# Patient Record
Sex: Female | Born: 1965 | Race: Black or African American | Hispanic: No | Marital: Single | Smoking: Current every day smoker
Health system: Southern US, Community
[De-identification: ages and names within clinical notes are randomized; demographics above are authoritative.]

---

## 2009-05-07 ENCOUNTER — Emergency Department (HOSPITAL_COMMUNITY): Admission: EM | Admit: 2009-05-07 | Discharge: 2009-05-08 | Payer: Self-pay | Admitting: Emergency Medicine

## 2009-12-03 IMAGING — CR DG CERVICAL SPINE COMPLETE 4+V
5 series · 5 of 5 positions shown · non-contrast
Comparison: None

CLINICAL DATA: Neck pain.

CERVICAL SPINE - COMPLETE 4+ VIEW

[w c-spine lat *]
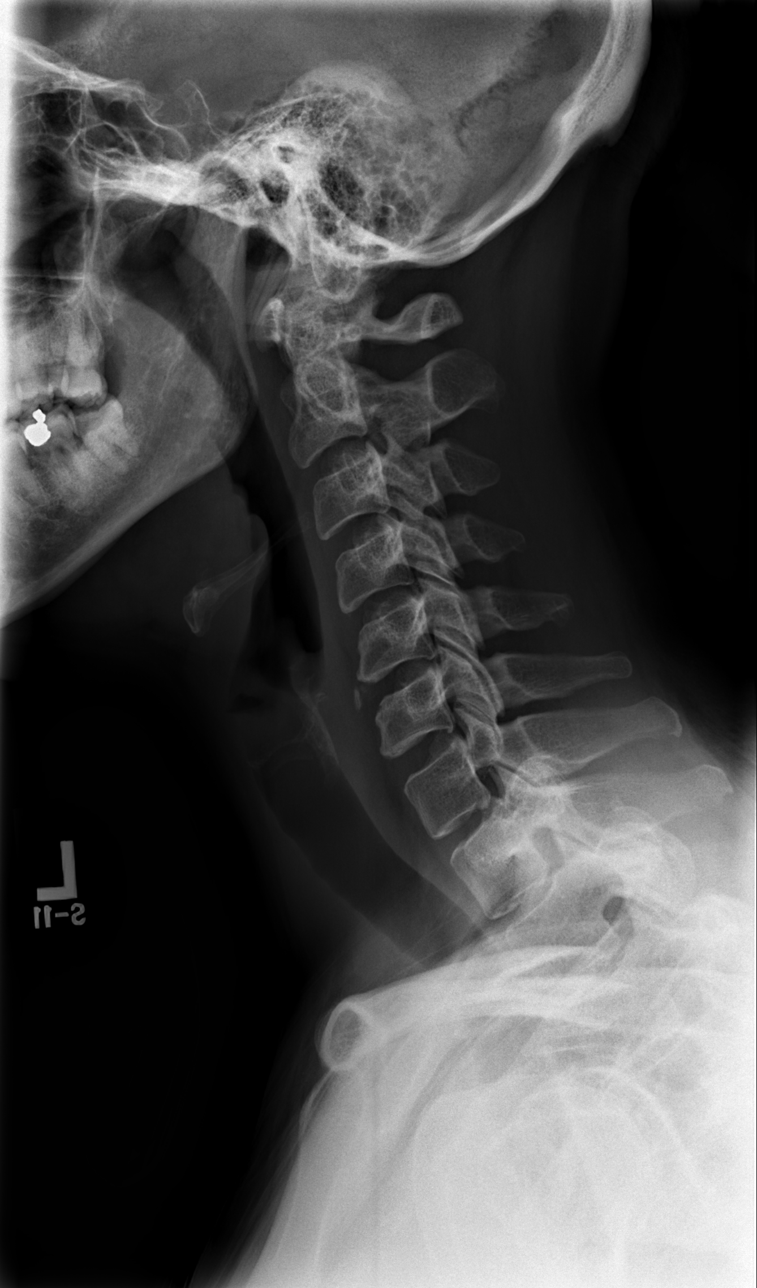

[w c-spine oblique * (1 of 2)]
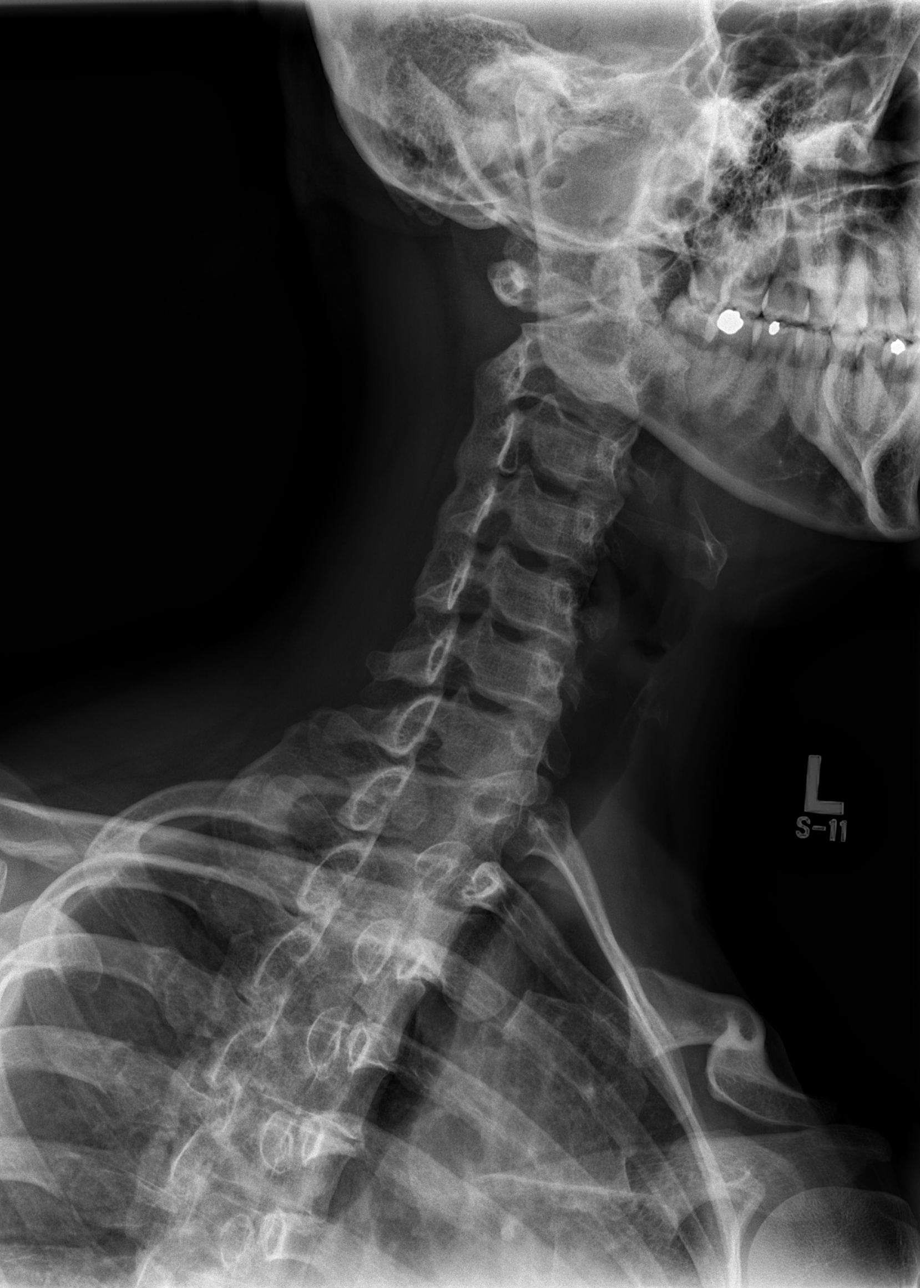

[w c-spine oblique * (2 of 2)]
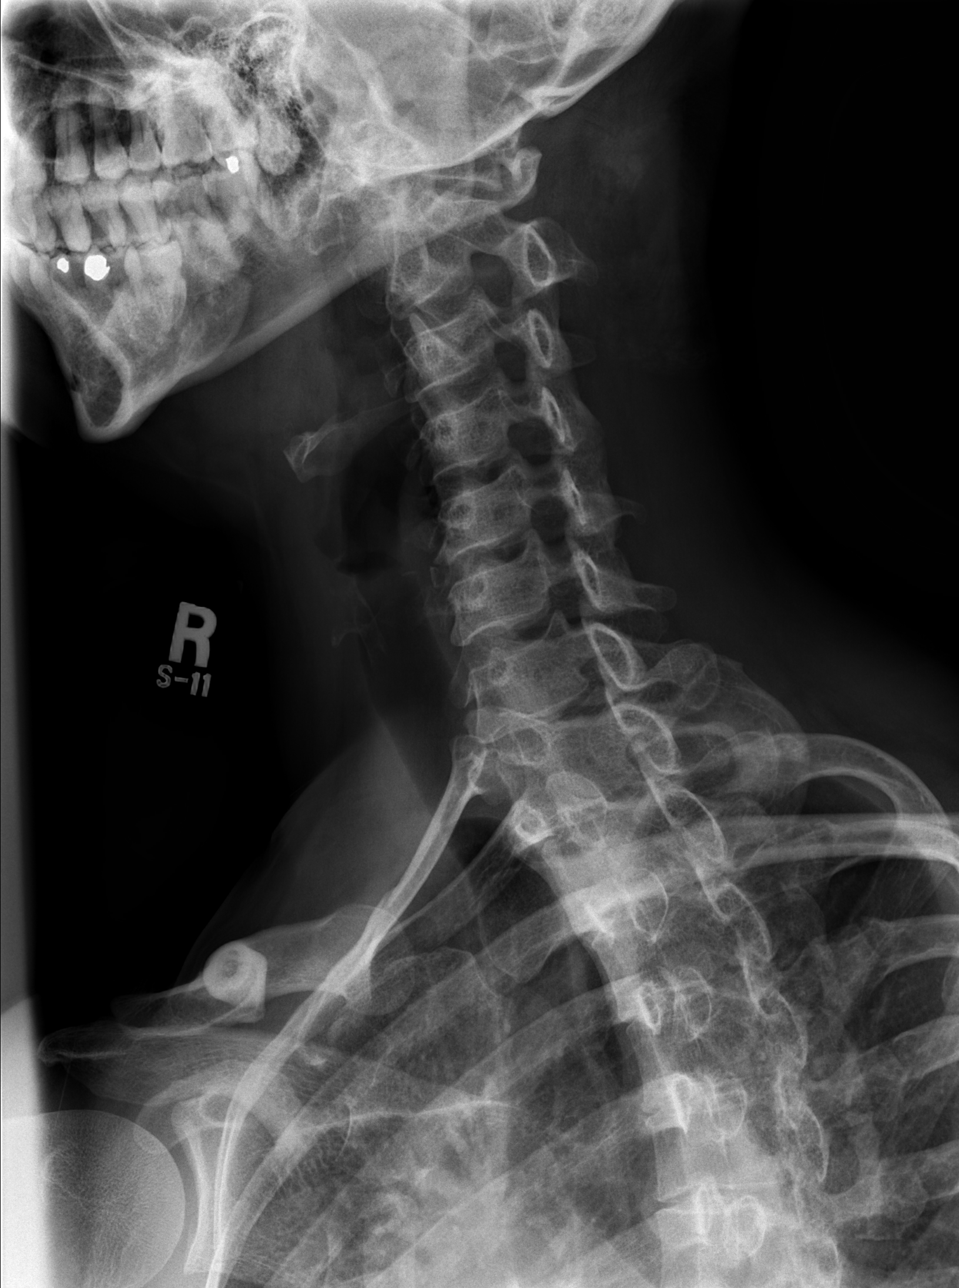

[w c-spine a.p.]
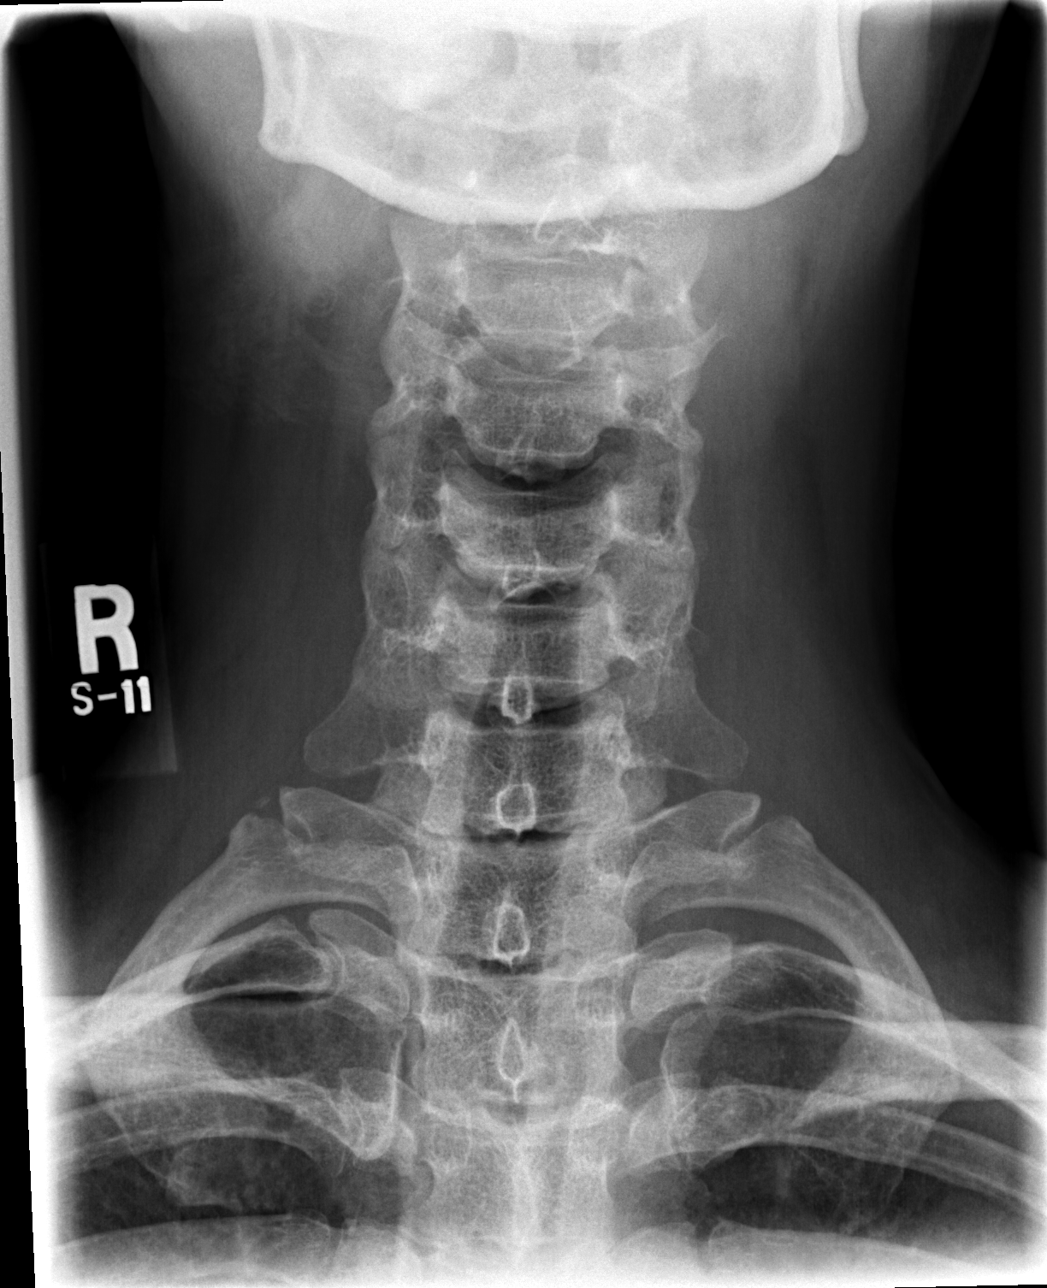

[w c-spine odontoid *]
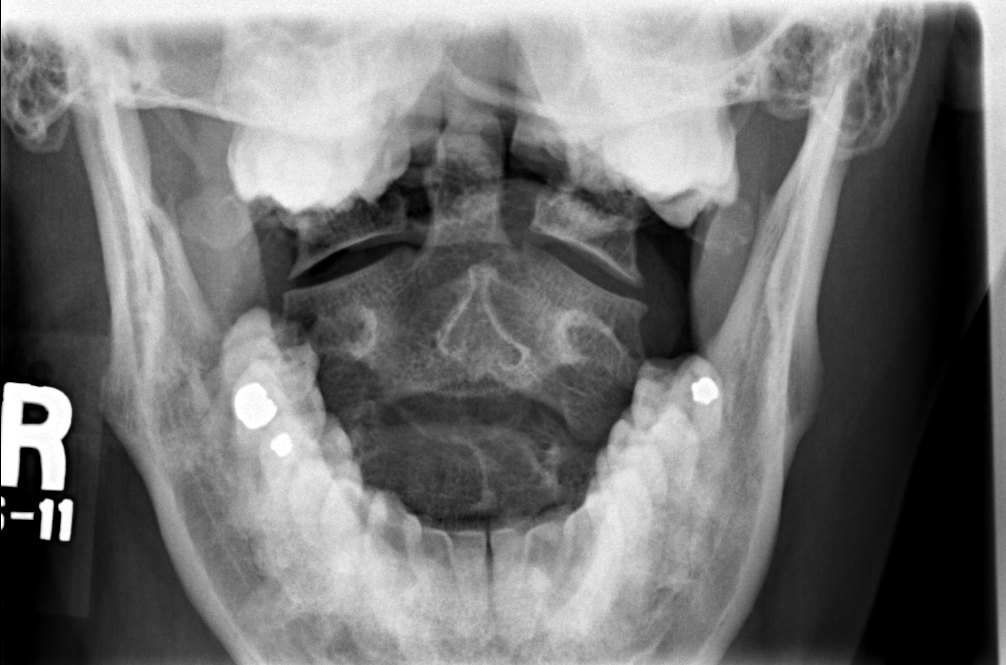

[5 of 5 positions shown; findings below may reference images not displayed]

FINDINGS: AP, lateral, obliques and odontoid view of the cervical
spine were obtained.  Normal alignment of the cervical spine and
cervicothoracic junction.  Normal appearance of the prevertebral
soft tissues.  There is mild narrowing of the right neural foramen
at C3-C4.
IMPRESSION: No acute bone abnormality to the cervical spine.

## 2012-02-01 ENCOUNTER — Emergency Department (HOSPITAL_COMMUNITY)
Admission: EM | Admit: 2012-02-01 | Discharge: 2012-02-01 | Disposition: A | Discharge status: Home / Self Care | Payer: Self-pay | Source: Home / Self Care | Attending: Family Medicine | Admitting: Family Medicine

## 2012-02-01 ENCOUNTER — Encounter (HOSPITAL_COMMUNITY): Payer: Self-pay

## 2012-02-01 DIAGNOSIS — L309 Dermatitis, unspecified: Secondary | ICD-10-CM

## 2012-02-01 DIAGNOSIS — L259 Unspecified contact dermatitis, unspecified cause: Secondary | ICD-10-CM

## 2012-02-01 MED ORDER — NYSTATIN-TRIAMCINOLONE 100000-0.1 UNIT/GM-% EX CREA: TOPICAL_CREAM | CUTANEOUS | Status: AC

## 2012-02-01 MED ORDER — HYDROXYZINE HCL 25 MG PO TABS: 25.0000 mg | ORAL_TABLET | Freq: Three times a day (TID) | ORAL | Status: AC | PRN

## 2012-02-01 NOTE — ED Notes (Signed)
States she had pain in her right arm 1 week ago, and when the pain became focused in her arm pit, she passed gas and felt better; has not had the pain since then; NAD; took no medication for this problem

## 2012-02-01 NOTE — Discharge Instructions (Signed)
Your right arm tender area presents with skin irritation and marks from scratching. Use the prescribed cream as instructed. Avoid scratching as it can cause infection. Also your blood pressure was above normal today 180/90 you need to establish care with a primary care provider and have your blood pressure monitored and rechecked in the next one to 4 weeks.

## 2012-02-04 NOTE — ED Provider Notes (Signed)
History     CSN: [id number]  Arrival date & time 02/01/12  1758   First MD Initiated Contact with Patient 02/01/12 1830      Chief Complaint  Patient presents with  . Arm Pain    (Consider location/radiation/quality/duration/timing/severity/associated sxs/prior treatment) HPI Comments: 46 y/o female. No significant PMH here c/o right arm discomfort. (poor historian). Explains that she had nausea, diarrhea and "indigestion" symptoms last week associated with right arm pain and "after passing gas her right arm pain improved". Denies current abdominal pain. No jaundice. Good appetite. Persistent discomfort in arm pit with itchiness. Denies vesicular rash. Area no longer tender but itchy. Denies current diarrhea, nausea or vomiting.  No prior h/o htn denies headache, blurry vision or chest pain.   History reviewed. No pertinent past medical history.  History reviewed. No pertinent past surgical history.  History reviewed. No pertinent family history.  History  Substance Use Topics  . Smoking status: Not on file  . Smokeless tobacco: Not on file  . Alcohol Use: Not on file    OB History    Grav Para Term Preterm Abortions TAB SAB Ect Mult Living                  Review of Systems  Constitutional: Negative for fever, chills and appetite change.  HENT: Negative for neck pain.   Respiratory: Negative for cough and shortness of breath.   Cardiovascular: Negative for chest pain and palpitations.  Gastrointestinal: Negative for nausea, vomiting, abdominal pain and diarrhea.  Genitourinary: Negative for dysuria.  Skin: Negative for rash.  Neurological: Negative for dizziness, weakness, numbness and headaches.    Allergies  Review of patient's allergies indicates no known allergies.  Home Medications   Current Outpatient Rx  Name Route Sig Dispense Refill  . HYDROXYZINE HCL 25 MG PO TABS Oral Take 1 tablet (25 mg total) by mouth every 8 (eight) hours as needed for itching.  12 tablet 0  . NYSTATIN-TRIAMCINOLONE 100000-0.1 UNIT/GM-% EX CREA  Apply to affected area daily 15 g 0    BP 180/90  Pulse 74  Temp(Src) 98.4 F (36.9 C) (Oral)  Resp 18  SpO2 99%  Physical Exam  Nursing note and vitals reviewed. Constitutional: She is oriented to person, place, and time. She appears well-developed and well-nourished. No distress.  HENT:  Head: Normocephalic and atraumatic.  Mouth/Throat: Oropharynx is clear and moist. No oropharyngeal exudate.  Eyes: Conjunctivae and EOM are normal. Pupils are equal, round, and reactive to light. No scleral icterus.  Neck: Neck supple. No JVD present. No thyromegaly present.  Cardiovascular: Normal rate, regular rhythm and normal heart sounds.   Pulmonary/Chest: Breath sounds normal. She has no wheezes. She has no rales.  Abdominal: Soft. There is no tenderness.  Musculoskeletal:       Right shoulder: FROM. No swelling or deformity. Right arm: normal muscle skeletal exam.  Lymphadenopathy:    She has no cervical adenopathy.  Neurological: She is alert and oriented to person, place, and time.  Skin:       There is a patch of hyperpigmented dry and thick skin in right medial arm proximal to axilla. With marks from scratching. No vesicles. Skin is mildly peeling but no skin brakes or ulcers. No axillary adenopathies. No similar lesions in other body areas.    ED Course  Procedures (including critical care time)  Labs Reviewed - No data to display No results found.   1. Dermatitis  MDM  Non specific pruriginous dermatitis, zoster in the differential but rash at this point is not classic. Prescribed triamcinolone/nystatin, hydroxyzine. Follow up with dermatology as needed. Pt asked to establish care with a PCP ti have BP recked in 1-4 weeks.         Sharin Grave, MD 02/04/12 [phone]

## 2013-12-25 ENCOUNTER — Ambulatory Visit (INDEPENDENT_AMBULATORY_CARE_PROVIDER_SITE_OTHER): Payer: BC Managed Care – PPO | Admitting: Family Medicine

## 2013-12-25 VITALS — BP 152/80 | HR 65 | Temp 98.1°F | Resp 16 | Ht 68.0 in | Wt 175.0 lb

## 2013-12-25 DIAGNOSIS — Z1231 Encounter for screening mammogram for malignant neoplasm of breast: Secondary | ICD-10-CM

## 2013-12-25 DIAGNOSIS — Z111 Encounter for screening for respiratory tuberculosis: Secondary | ICD-10-CM

## 2013-12-25 DIAGNOSIS — Z0000 Encounter for general adult medical examination without abnormal findings: Secondary | ICD-10-CM

## 2013-12-25 LAB — POCT UA - MICROSCOPIC ONLY
Bacteria, U Microscopic: NEGATIVE
Casts, Ur, LPF, POC: NEGATIVE
Crystals, Ur, HPF, POC: NEGATIVE
Mucus, UA: NEGATIVE
RBC, urine, microscopic: NEGATIVE
Yeast, UA: NEGATIVE

## 2013-12-25 LAB — POCT CBC
Granulocyte percent: 50.8 %G (ref 37–80)
HCT, POC: 39.2 % (ref 37.7–47.9)
Hemoglobin: 12.1 g/dL — AB (ref 12.2–16.2)
Lymph, poc: 2.6 (ref 0.6–3.4)
MCH, POC: 28.4 pg (ref 27–31.2)
MCHC: 30.9 g/dL — AB (ref 31.8–35.4)
MCV: 92.1 fL (ref 80–97)
MID (cbc): 0.5 (ref 0–0.9)
MPV: 11.5 fL (ref 0–99.8)
POC Granulocyte: 3.3 (ref 2–6.9)
POC LYMPH PERCENT: 41.4 % (ref 10–50)
POC MID %: 7.8 % (ref 0–12)
Platelet Count, POC: 221 10*3/uL (ref 142–424)
RBC: 4.26 M/uL (ref 4.04–5.48)
RDW, POC: 15.6 %
WBC: 6.4 10*3/uL (ref 4.6–10.2)

## 2013-12-25 LAB — POCT URINALYSIS DIPSTICK
Bilirubin, UA: NEGATIVE
Blood, UA: NEGATIVE
Glucose, UA: NEGATIVE
Leukocytes, UA: NEGATIVE
Nitrite, UA: NEGATIVE
Protein, UA: 30
Spec Grav, UA: 1.02
Urobilinogen, UA: 1
pH, UA: 7

## 2013-12-25 NOTE — Progress Notes (Signed)
Tuberculosis Risk Questionnaire  1. No Were you born outside the [place] in one of the following parts of the world: [place], [place], [place], [place] or [place]?    2. No Have you traveled outside the [place] and lived for more than one month in one of the following parts of the world: [place], [place], [place], [place] or [place]?    3. No Do you have a compromised immune system such as from any of the following conditions:HIV/AIDS, organ or bone marrow transplantation, diabetes, immunosuppressive medicines (e.g. Prednisone, Remicaide), leukemia, lymphoma, cancer of the head or neck, gastrectomy or jejunal bypass, end-stage renal disease (on dialysis), or silicosis?     4. No Have you ever or do you plan on working in: a residential care center, a health care facility, a jail or prison or homeless shelter?    5. No Have you ever: injected illegal drugs, used crack cocaine, lived in a homeless shelter  or been in jail or prison?     6. No Have you ever been exposed to anyone with infectious tuberculosis?    Tuberculosis Symptom Questionnaire  Do you currently have any of the following symptoms?  1. No Unexplained cough lasting more than 3 weeks?   2. No Unexplained fever lasting more than 3 weeks.   3. No Night Sweats (sweating that leaves the bedclothes and sheets wet)     4. No Shortness of Breath   5. No Chest Pain   6. No Unintentional weight loss    7. No Unexplained fatigue (very tired for no reason)        Chief Complaint:  Chief Complaint  Patient presents with  . Annual Exam    For work--needs TB test    HPI: Monica Benitez is a 48 y.o. female who is here for  Annual exam without pap and desires TB test She has not eaten today. She is applying for childcare Work  No medical issues No prior hsitory of TB or sxs Last pap in [place] Dr Abbe [place], in 2014, had abnormal pap , will see him next month to et pap so does not  want anything pelvic related dones.  G13L1 AA female Menarche age 63, still has regular cycle but less flow, q30 days No prior mammogram  History reviewed. No pertinent past medical history. History reviewed. No pertinent past surgical history. History   Social History  . Marital Status: Single    Spouse Name: N/A    Number of Children: N/A  . Years of Education: N/A   Social History Main Topics  . Smoking status: Current Every Day Smoker  . Smokeless tobacco: None  . Alcohol Use: No  . Drug Use: No  . Sexual Activity: None   Other Topics Concern  . None   Social History Narrative  . None   Family History  Problem Relation Age of Onset  . Cancer Father    No Known Allergies Prior to Admission medications   Not on File     ROS: The patient denies fevers, chills, night sweats, unintentional weight loss, chest pain, palpitations, wheezing, dyspnea on exertion, nausea, vomiting, abdominal pain, dysuria, hematuria, melena, numbness, weakness, or tingling.   All other systems have been reviewed and were otherwise negative with the exception of those mentioned in the HPI and as above.    PHYSICAL EXAM: Filed Vitals:   12/25/13 1312  BP: 152/80  Pulse: 65  Temp: 98.1 F (36.7 C)  Resp: 16   Filed Vitals:   12/25/13 1312  Height: 5\' 8"  (1.727 m)  Weight: 175 lb (79.379 kg)   Body mass index is 26.61 kg/(m^2).  General: Alert, no acute distress HEENT:  Normocephalic, atraumatic, oropharynx patent. EOMI, PERRLA, fundo exam nl. Tm nl Cardiovascular:  Regular rate and rhythm, no rubs murmurs or gallops.  No Carotid bruits, radial pulse intact. No pedal edema.  Respiratory: Clear to auscultation bilaterally.  No wheezes, rales, or rhonchi.  No cyanosis, no use of accessory musculature GI: No organomegaly, abdomen is soft and non-tender, positive bowel sounds.  No masses. Skin: No rashes. Neurologic: Facial musculature symmetric. Psychiatric: Patient is appropriate  throughout our interaction. Lymphatic: No cervical lymphadenopathy Musculoskeletal: Gait intact. 5/5 strength, 2/2 DTRs Breast exam normal   LABS: No results found for this or any previous visit.   EKG/XRAY:   Primary read interpreted by [facility].   ASSESSMENT/PLAN: Encounter Diagnoses  Name Primary?  . Routine general medical examination at a health care facility Yes  . Screening-pulmonary TB   . Visit for screening mammogram    401L1 AA female her for annual with PPD testing Has had abnormal pap so will see her ob/gyn Dr Abbe AmsterdamHopkins for pelvic/pap and rectal exam Needs referral for screening mammogram Annual labs pending: CBC, CMP,lipid, UA PPD screening F/u in 48-72 hrs for TB Reading  Gross sideeffects, risk and benefits, and alternatives of medications d/w patient. Patient is aware that all medications have potential sideeffects and we are unable to predict every sideeffect or drug-drug interaction that may occur.  LE, THAO PHUONG, DO 12/25/2013 1:56 PM

## 2013-12-26 LAB — COMPREHENSIVE METABOLIC PANEL WITH GFR
ALT: 12 U/L (ref 0–35)
AST: 13 U/L (ref 0–37)
Albumin: 4.3 g/dL (ref 3.5–5.2)
BUN: 13 mg/dL (ref 6–23)
CO2: 28 meq/L (ref 19–32)
Calcium: 9.5 mg/dL (ref 8.4–10.5)
Chloride: 103 meq/L (ref 96–112)
Creat: 0.82 mg/dL (ref 0.50–1.10)
Potassium: 3.6 meq/L (ref 3.5–5.3)

## 2013-12-26 LAB — LIPID PANEL
Cholesterol: 143 mg/dL (ref 0–200)
HDL: 42 mg/dL (ref >39)
LDL Cholesterol: 89 mg/dL (ref 0–99)
Total CHOL/HDL Ratio: 3.4 Ratio
Triglycerides: 60 mg/dL (ref <150)
VLDL: 12 mg/dL (ref 0–40)

## 2013-12-26 LAB — COMPREHENSIVE METABOLIC PANEL
Alkaline Phosphatase: 49 U/L (ref 39–117)
Glucose, Bld: 73 mg/dL (ref 70–99)
Sodium: 139 mEq/L (ref 135–145)
Total Bilirubin: 0.6 mg/dL (ref 0.2–1.2)
Total Protein: 7 g/dL (ref 6.0–8.3)

## 2013-12-27 ENCOUNTER — Encounter: Payer: Self-pay | Admitting: Family Medicine

## 2013-12-28 ENCOUNTER — Ambulatory Visit (INDEPENDENT_AMBULATORY_CARE_PROVIDER_SITE_OTHER): Payer: BC Managed Care – PPO | Admitting: *Deleted

## 2013-12-28 DIAGNOSIS — Z111 Encounter for screening for respiratory tuberculosis: Secondary | ICD-10-CM

## 2013-12-28 LAB — TB SKIN TEST: TB Skin Test: NEGATIVE

## 2019-08-21 ENCOUNTER — Other Ambulatory Visit: Payer: Self-pay

## 2019-08-21 DIAGNOSIS — Z20822 Contact with and (suspected) exposure to covid-19: Secondary | ICD-10-CM

## 2019-08-23 LAB — NOVEL CORONAVIRUS, NAA: SARS-CoV-2, NAA: DETECTED — AB

## 2019-12-02 ENCOUNTER — Ambulatory Visit: Payer: Self-pay | Attending: Internal Medicine

## 2019-12-02 DIAGNOSIS — Z23 Encounter for immunization: Secondary | ICD-10-CM | POA: Insufficient documentation

## 2019-12-02 NOTE — Progress Notes (Signed)
   Covid-19 Vaccination Clinic  Name:  Monica Benitez    MRN: [id number] DOB: 04/18/1966  12/02/2019  [name] was observed post Covid-19 immunization for 15 minutes without incidence. She was provided with Vaccine Information Sheet and instruction to access the V-Safe system.   [name] was instructed to call 911 with any severe reactions post vaccine: Marland Kitchen Difficulty breathing  . Swelling of your face and throat  . A fast heartbeat  . A bad rash all over your body  . Dizziness and weakness    Immunizations Administered    Name Date Dose VIS Date Route   Pfizer COVID-19 Vaccine 12/02/2019 10:38 AM 0.3 mL 09/15/2019 Intramuscular   Manufacturer: [employer], [employer]   Lot: [id number]   NDC: [address]-6

## 2019-12-27 ENCOUNTER — Ambulatory Visit: Payer: Self-pay | Attending: Internal Medicine

## 2019-12-27 DIAGNOSIS — Z23 Encounter for immunization: Secondary | ICD-10-CM

## 2019-12-27 NOTE — Progress Notes (Signed)
   Covid-19 Vaccination Clinic  Name:  Monica Benitez    MRN: [id number] DOB: 1966/04/23  12/27/2019  [name] was observed post Covid-19 immunization for 15 minutes without incident. She was provided with Vaccine Information Sheet and instruction to access the V-Safe system.   [name] was instructed to call 911 with any severe reactions post vaccine: Marland Kitchen Difficulty breathing  . Swelling of face and throat  . A fast heartbeat  . A bad rash all over body  . Dizziness and weakness   Immunizations Administered    Name Date Dose VIS Date Route   Pfizer COVID-19 Vaccine 12/27/2019  2:01 PM 0.3 mL 09/15/2019 Intramuscular   Manufacturer: [employer], [employer]   Lot: [id number]   NDC: [address]-7

## 2020-10-08 ENCOUNTER — Ambulatory Visit (HOSPITAL_COMMUNITY)
Admission: EM | Admit: 2020-10-08 | Discharge: 2020-10-08 | Disposition: A | Discharge status: Home / Self Care | Payer: Self-pay

## 2023-01-06 ENCOUNTER — Encounter (HOSPITAL_COMMUNITY): Payer: Self-pay

## 2023-01-06 ENCOUNTER — Ambulatory Visit (HOSPITAL_COMMUNITY)
Admission: EM | Admit: 2023-01-06 | Discharge: 2023-01-06 | Disposition: A | Discharge status: Home / Self Care | Payer: BLUE CROSS/BLUE SHIELD | Attending: Emergency Medicine | Admitting: Emergency Medicine

## 2023-01-06 DIAGNOSIS — J029 Acute pharyngitis, unspecified: Secondary | ICD-10-CM

## 2023-01-06 DIAGNOSIS — R030 Elevated blood-pressure reading, without diagnosis of hypertension: Secondary | ICD-10-CM

## 2023-01-06 MED ORDER — DEXAMETHASONE SODIUM PHOSPHATE 10 MG/ML IJ SOLN
10.0000 mg | Freq: Once | INTRAMUSCULAR | Status: AC
  Administered 2023-01-06: 10 mg via INTRAMUSCULAR

## 2023-01-06 MED ORDER — AMOXICILLIN-POT CLAVULANATE 875-125 MG PO TABS: 1.0000 | ORAL_TABLET | Freq: Two times a day (BID) | ORAL | 0 refills | Status: DC

## 2023-01-06 MED ORDER — DEXAMETHASONE SODIUM PHOSPHATE 10 MG/ML IJ SOLN
INTRAMUSCULAR | Status: AC
  Filled 2023-01-06: qty 1

## 2023-01-06 MED ORDER — PROMETHAZINE-DM 6.25-15 MG/5ML PO SYRP: 5.0000 mL | ORAL_SOLUTION | Freq: Three times a day (TID) | ORAL | 0 refills | Status: DC | PRN

## 2023-01-06 NOTE — ED Provider Notes (Signed)
[facility]    CSN: [id number] Arrival date & time: 01/06/23  0810      History   Chief Complaint Chief Complaint  Patient presents with   Otalgia    HPI Monica Benitez is a 57 y.o. female.   Patient presents to clinic for left-sided sore throat, left-sided neck pain and left-sided ear pain with nasal congestion.  Her symptoms have been worsening over the past 3 days, she reports difficulty swallowing and left-sided throat swelling with voice change.  She denies fever, shortness of breath or chest pain.  She has had an intermittent productive cough with phlegm.  She has been taking ibuprofen and medication for sinus congestion/watery eyes over-the-counter.   She does not have a primary care provider and denies any history of hypertension or antihypertensive medications.     The history is provided by the patient and medical records.  Otalgia Associated symptoms: congestion, cough and sore throat   Associated symptoms: no abdominal pain, no ear discharge, no fever, no rash and no vomiting     History reviewed. No pertinent past medical history.  There are no problems to display for this patient.   History reviewed. No pertinent surgical history.  OB History   No obstetric history on file.      Home Medications    Prior to Admission medications   Medication Sig Start Date End Date Taking? Authorizing Provider  amoxicillin-clavulanate (AUGMENTIN) 875-125 MG tablet Take 1 tablet by mouth every 12 (twelve) hours. 01/06/23  Yes Louretta Shorten, [place], FNP  promethazine-dextromethorphan (PROMETHAZINE-DM) 6.25-15 MG/5ML syrup Take 5 mLs by mouth 3 (three) times daily as needed for cough. 01/06/23  Yes Shakeeta Godette, [place], FNP    Family History Family History  Problem Relation Age of Onset   Cancer Father     Social History Social History   Tobacco Use   Smoking status: Every Day  Substance Use Topics   Alcohol use: No   Drug use: No     Allergies    Patient has no known allergies.   Review of Systems Review of Systems  Constitutional:  Negative for chills and fever.  HENT:  Positive for congestion, ear pain, postnasal drip, sore throat, trouble swallowing and voice change. Negative for drooling, ear discharge, nosebleeds, sinus pressure and sinus pain.   Eyes:  Negative for pain, discharge and visual disturbance.  Respiratory:  Positive for cough. Negative for shortness of breath.   Cardiovascular:  Negative for chest pain and palpitations.  Gastrointestinal:  Negative for abdominal pain and vomiting.  Genitourinary:  Negative for dysuria and hematuria.  Musculoskeletal:  Negative for arthralgias and back pain.  Skin:  Negative for color change and rash.  Neurological:  Negative for seizures and syncope.  All other systems reviewed and are negative.    Physical Exam Triage Vital Signs ED Triage Vitals [01/06/23 0823]  Enc Vitals Group     BP (!) 183/119     Pulse Rate 82     Resp 16     Temp 98.2 F (36.8 C)     Temp src      SpO2 99 %     Weight      Height      Head Circumference      Peak Flow      Pain Score 7     Pain Loc      Pain Edu?      Excl. in [place]?    No data  found.  Updated Vital Signs BP (!) 183/119 (BP Location: Left Arm)   Pulse 82   Temp 98.2 F (36.8 C)   Resp 16   SpO2 99%   Visual Acuity Right Eye Distance:   Left Eye Distance:   Bilateral Distance:    Right Eye Near:   Left Eye Near:    Bilateral Near:     Physical Exam Vitals and nursing note reviewed.  Constitutional:      General: She is not in acute distress.    Appearance: She is well-developed.  HENT:     Head: Normocephalic and atraumatic.     Right Ear: Tympanic membrane, ear canal and external ear normal.     Left Ear: Tympanic membrane, ear canal and external ear normal.     Ears:     Comments: Moderate amount of cerumen in left ear.    Nose: Rhinorrhea present.     Mouth/Throat:     Mouth: Mucous membranes  are moist.     Tongue: No lesions. Tongue does not deviate from midline.     Palate: No mass and lesions.     Pharynx: Oropharynx is clear. Uvula midline. Posterior oropharyngeal erythema present. No pharyngeal swelling, oropharyngeal exudate or uvula swelling.     Tonsils: No tonsillar exudate. 1+ on the right. 3+ on the left.     Comments: Left-sided tonsillar swelling 3+, without exudate or obvious purulence, associated left sided LAD Eyes:     Conjunctiva/sclera: Conjunctivae normal.  Cardiovascular:     Rate and Rhythm: Normal rate and regular rhythm.     Heart sounds: Normal heart sounds. No murmur heard. Pulmonary:     Effort: Pulmonary effort is normal. No respiratory distress.     Breath sounds: Normal breath sounds. No wheezing.  Musculoskeletal:        General: No swelling. Normal range of motion.     Cervical back: Neck supple.  Lymphadenopathy:     Cervical: Cervical adenopathy present.  Skin:    General: Skin is warm and dry.     Capillary Refill: Capillary refill takes less than 2 seconds.  Neurological:     Mental Status: She is alert.  Psychiatric:        Mood and Affect: Mood normal.        Behavior: Behavior is cooperative.      UC Treatments / Results  Labs (all labs ordered are listed, but only abnormal results are displayed) Labs Reviewed - No data to display  EKG   Radiology No results found.  Procedures Procedures (including critical care time)  Medications Ordered in UC Medications  dexamethasone (DECADRON) injection 10 mg (has no administration in time range)    Initial Impression / Assessment and Plan / UC Course  I have reviewed the triage vital signs and the nursing notes.  Pertinent labs & imaging results that were available during my care of the patient were reviewed by me and considered in my medical decision making (see chart for details).  Vitals in triage reviewed, patient is hemodynamically stable.  Left-sided tonsillar  swelling with associated left-sided cervical LAD.  Patient denies shortness of breath, room air oxygenation 99%, low concern for airway occlusion at this point.  Positive for voice change, will cover for peritonsillar abscess with Augmentin and a shot of Decadron 10 mg in clinic. Discussed strict emergency precautions reviewed, patient verbalized understanding.  Symptomatic relief discussed, discussed elevated blood pressure in clinic and advised follow-up with a primary care  provider for further evaluation.    Final Clinical Impressions(s) / UC Diagnoses   Final diagnoses:  Acute pharyngitis, unspecified etiology  Elevated blood pressure reading     Discharge Instructions      I am covering you with antibiotics for potential throat infection.  Please take all antibiotics as prescribed and until finished, you can take them with food to help prevent stomach upset.  Please continue your over-the-counter treatment methods with ibuprofen, you can alternate with Tylenol every 4-6 hours.  You can take the promethazine cough syrup as needed for cough, do not drink or drive on this medication as it may make you drowsy.  You can do saline gargles and tea with honey as well as cough drops for symptomatic relief of your sore throat.  Please seek immediate care if you do find that you are drooling, cannot swallow, your throat swelling progresses, you develop shortness of breath.  Your blood pressure was elevated today in clinic, please follow-up with a primary care provider for further evaluation and to establish care.      ED Prescriptions     Medication Sig Dispense Auth. Provider   amoxicillin-clavulanate (AUGMENTIN) 875-125 MG tablet Take 1 tablet by mouth every 12 (twelve) hours. 14 tablet Louretta Shorten, [place], [place]   promethazine-dextromethorphan (PROMETHAZINE-DM) 6.25-15 MG/5ML syrup Take 5 mLs by mouth 3 (three) times daily as needed for cough. 118 mL Curtiss Mahmood, [place], [place]      PDMP not  reviewed this encounter.   Louretta Shorten [place], [place] 01/06/23 [phone]

## 2023-01-06 NOTE — ED Triage Notes (Signed)
Pt is here for left ear pain and facial pain x few days the patient used ibuprofen and sinus pressure meds , pt got little relief ,

## 2023-01-06 NOTE — Discharge Instructions (Signed)
I am covering you with antibiotics for potential throat infection.  Please take all antibiotics as prescribed and until finished, you can take them with food to help prevent stomach upset.  Please continue your over-the-counter treatment methods with ibuprofen, you can alternate with Tylenol every 4-6 hours.  You can take the promethazine cough syrup as needed for cough, do not drink or drive on this medication as it may make you drowsy.  You can do saline gargles and tea with honey as well as cough drops for symptomatic relief of your sore throat.  Please seek immediate care if you do find that you are drooling, cannot swallow, your throat swelling progresses, you develop shortness of breath.  Your blood pressure was elevated today in clinic, please follow-up with a primary care provider for further evaluation and to establish care.

## 2024-04-24 ENCOUNTER — Encounter (HOSPITAL_COMMUNITY): Payer: Self-pay | Admitting: *Deleted

## 2024-04-24 ENCOUNTER — Other Ambulatory Visit: Payer: Self-pay

## 2024-04-24 ENCOUNTER — Ambulatory Visit (HOSPITAL_COMMUNITY)
Admission: EM | Admit: 2024-04-24 | Discharge: 2024-04-24 | Disposition: A | Discharge status: Home / Self Care | Attending: Nurse Practitioner | Admitting: Nurse Practitioner

## 2024-04-24 DIAGNOSIS — I160 Hypertensive urgency: Secondary | ICD-10-CM | POA: Insufficient documentation

## 2024-04-24 DIAGNOSIS — I10 Essential (primary) hypertension: Secondary | ICD-10-CM | POA: Diagnosis present

## 2024-04-24 LAB — COMPREHENSIVE METABOLIC PANEL WITH GFR
ALT: 12 U/L (ref 0–44)
AST: 15 U/L (ref 15–41)
Albumin: 3.8 g/dL (ref 3.5–5.0)
Alkaline Phosphatase: 68 U/L (ref 38–126)
Anion gap: 10 (ref 5–15)
BUN: 11 mg/dL (ref 6–20)
CO2: 24 mmol/L (ref 22–32)
Calcium: 9.5 mg/dL (ref 8.9–10.3)
Chloride: 104 mmol/L (ref 98–111)
Creatinine, Ser: 1 mg/dL (ref 0.44–1.00)
GFR, Estimated: >60 mL/min (ref >60)
Glucose, Bld: 88 mg/dL (ref 70–99)
Potassium: 3.4 mmol/L — ABNORMAL LOW (ref 3.5–5.1)
Sodium: 138 mmol/L (ref 135–145)
Total Bilirubin: 1.2 mg/dL (ref 0.0–1.2)
Total Protein: 7.1 g/dL (ref 6.5–8.1)

## 2024-04-24 LAB — CBC WITH DIFFERENTIAL/PLATELET
Abs Immature Granulocytes: 0.01 K/uL (ref 0.00–0.07)
Basophils Absolute: 0.1 K/uL (ref 0.0–0.1)
Basophils Relative: 1 %
Eosinophils Absolute: 0.1 K/uL (ref 0.0–0.5)
Eosinophils Relative: 2 %
HCT: 41.6 % (ref 36.0–46.0)
Hemoglobin: 14.2 g/dL (ref 12.0–15.0)
Immature Granulocytes: 0 %
Lymphocytes Relative: 41 %
Lymphs Abs: 2 K/uL (ref 0.7–4.0)
MCH: 30.4 pg (ref 26.0–34.0)
MCHC: 34.1 g/dL (ref 30.0–36.0)
MCV: 89.1 fL (ref 80.0–100.0)
Monocytes Absolute: 0.3 K/uL (ref 0.1–1.0)
Monocytes Relative: 6 %
Neutro Abs: 2.5 K/uL (ref 1.7–7.7)
Neutrophils Relative %: 50 %
Platelets: 155 K/uL (ref 150–400)
RBC: 4.67 MIL/uL (ref 3.87–5.11)
RDW: 13.1 % (ref 11.5–15.5)
WBC: 5 K/uL (ref 4.0–10.5)
nRBC: 0 % (ref 0.0–0.2)

## 2024-04-24 LAB — LIPID PANEL
Cholesterol: 156 mg/dL (ref 0–200)
HDL: 46 mg/dL (ref >40)
LDL Cholesterol: 94 mg/dL (ref 0–99)
Total CHOL/HDL Ratio: 3.4 ratio
Triglycerides: 80 mg/dL (ref <150)
VLDL: 16 mg/dL (ref 0–40)

## 2024-04-24 LAB — TSH: TSH: 0.535 u[IU]/mL (ref 0.350–4.500)

## 2024-04-24 MED ORDER — CLONIDINE HCL 0.1 MG PO TABS
0.2000 mg | ORAL_TABLET | Freq: Once | ORAL | Status: AC
  Administered 2024-04-24: 0.2 mg via ORAL

## 2024-04-24 MED ORDER — CLONIDINE HCL 0.1 MG PO TABS
ORAL_TABLET | ORAL | Status: AC
  Filled 2024-04-24: qty 1

## 2024-04-24 MED ORDER — CLONIDINE HCL 0.1 MG PO TABS
ORAL_TABLET | ORAL | Status: AC
  Filled 2024-04-24: qty 2

## 2024-04-24 MED ORDER — AMLODIPINE BESYLATE-VALSARTAN 5-160 MG PO TABS: 1.0000 | ORAL_TABLET | Freq: Every day | ORAL | 0 refills | Status: AC

## 2024-04-24 MED ORDER — ASPIRIN 81 MG PO TBEC: 81.0000 mg | DELAYED_RELEASE_TABLET | Freq: Every day | ORAL | 12 refills | Status: AC

## 2024-04-24 MED ORDER — CLONIDINE HCL 0.1 MG PO TABS
0.1000 mg | ORAL_TABLET | Freq: Once | ORAL | Status: AC
  Administered 2024-04-24: 0.1 mg via ORAL

## 2024-04-24 NOTE — ED Notes (Signed)
 Patient lying on her left side.

## 2024-04-24 NOTE — Discharge Instructions (Addendum)
 You were seen today for severely elevated blood pressure. Although you did not have any symptoms such as headache, chest pain, or vision changes, your readings were high enough to put you at risk for serious complications if left untreated. You were given a blood pressure medication in the clinic, and your blood pressure improved before discharge. You have been prescribed a daily medication to help manage your blood pressure, along with low-dose aspirin . It is important to take your medications exactly as directed and to avoid missing doses.  You should begin checking your blood pressure at home every day, preferably at the same time each day, and keep a written log of your readings to bring to your first follow-up visit. Use a validated home monitor and sit quietly for a few minutes before taking your reading. Avoid smoking, reduce salt intake, stay well hydrated, and aim for regular physical activity as tolerated.  You were referred to Dr. Catheryn Slocumb or another provider in her clinic and should call as soon as possible to schedule your first appointment. Only a 30-day supply of medication has been provided today, so timely follow-up is essential to continue care.  Go to the emergency department right away if you develop symptoms such as chest pain, shortness of breath, confusion, trouble speaking, weakness on one side, vision changes, or a severe headache that does not go away. Return to urgent care if your blood pressure remains consistently above 180/110 despite medication, or if you have questions or concerns before your scheduled appointment.

## 2024-04-24 NOTE — ED Provider Notes (Signed)
 [facility]    CSN: [id number] Arrival date & time: 04/24/24  1343      History   Chief Complaint Chief Complaint  Patient presents with   Hypertension    HPI Monica Benitez is a 58 y.o. female.   Discussed the use of AI scribe software for clinical note transcription with the patient, who gave verbal consent to proceed.   Patient presents after being unable to get routine dental work due to severely elevated blood pressure. The patient's blood pressure was measured at the dentist's office with readings of 226/145, 216/134, and 211/144. The patient reports no symptoms typically associated with hypertension, denying headache, dizziness, chest pain, shortness of breath, palpitations, blurred vision, or seeing spots. The patient has a history of elevated blood pressure readings, including a measurement of 183/119 during an Urgent Care visit on April 3rd of this year, and a reading of 180/90 in 2013. The patient recalls being told to get her blood pressure re-checked at the time she was seen on 01/06/24 for a sinus infection, but states that everything was okay at that time. The patient has medical insurance. She does not currently take any medications for blood pressure management and does not have a primary care provider.  The patient reports smoking cigarettes and drinking alcohol socially. She does not have a blood pressure cuff at home for self-monitoring.  The following portions of the patient's history were reviewed and updated as appropriate: allergies, current medications, past family history, past medical history, past social history, past surgical history, and problem list.    History reviewed. No pertinent past medical history.  There are no active problems to display for this patient.   History reviewed. No pertinent surgical history.  OB History   No obstetric history on file.      Home Medications    Prior to Admission medications   Medication Sig  Start Date End Date Taking? Authorizing Provider  amLODipine -valsartan  (EXFORGE ) 5-160 MG tablet Take 1 tablet by mouth daily. 04/24/24  Yes Iola Lukes, FNP  aspirin  EC 81 MG tablet Take 1 tablet (81 mg total) by mouth daily. Swallow whole. 04/24/24  Yes Iola Lukes, FNP    Family History Family History  Problem Relation Age of Onset   Cancer Father     Social History Social History   Tobacco Use   Smoking status: Every Day  Substance Use Topics   Alcohol use: No   Drug use: No     Allergies   Patient has no known allergies.   Review of Systems Review of Systems  Eyes:  Negative for visual disturbance.  Cardiovascular:  Negative for chest pain and palpitations.  Gastrointestinal:  Negative for nausea and vomiting.  Neurological:  Negative for dizziness, numbness and headaches.  All other systems reviewed and are negative.    Physical Exam Triage Vital Signs ED Triage Vitals  Encounter Vitals Group     BP 04/24/24 1406 (!) 235/116     Girls Systolic BP Percentile --      Girls Diastolic BP Percentile --      Boys Systolic BP Percentile --      Boys Diastolic BP Percentile --      Pulse Rate 04/24/24 1406 95     Resp 04/24/24 1406 18     Temp 04/24/24 1406 99.3 F (37.4 C)     Temp src --      SpO2 04/24/24 1406 99 %     Weight --  Height --      Head Circumference --      Peak Flow --      Pain Score 04/24/24 1410 0     Pain Loc --      Pain Education --      Exclude from Growth Chart --    No data found.  Updated Vital Signs BP (!) 163/90 (BP Location: Right Arm)   Pulse 72   Temp 99.3 F (37.4 C)   Resp 18   SpO2 99%   Visual Acuity Right Eye Distance:   Left Eye Distance:   Bilateral Distance:    Right Eye Near:   Left Eye Near:    Bilateral Near:     Physical Exam Vitals reviewed.  Constitutional:      General: She is awake. She is not in acute distress.    Appearance: Normal appearance. She is well-developed. She is  not ill-appearing, toxic-appearing or diaphoretic.  HENT:     Head: Normocephalic.     Right Ear: Hearing normal.     Left Ear: Hearing normal.     Nose: Nose normal.     Mouth/Throat:     Mouth: Mucous membranes are moist.  Eyes:     General: Vision grossly intact.     Conjunctiva/sclera: Conjunctivae normal.  Cardiovascular:     Rate and Rhythm: Normal rate and regular rhythm.     Heart sounds: Normal heart sounds.  Pulmonary:     Effort: Pulmonary effort is normal.     Breath sounds: Normal breath sounds and air entry.  Musculoskeletal:        General: Normal range of motion.     Cervical back: Full passive range of motion without pain, normal range of motion and neck supple.     Right lower leg: No edema.     Left lower leg: No edema.  Skin:    General: Skin is warm and dry.  Neurological:     General: No focal deficit present.     Mental Status: She is alert and oriented to person, place, and time.  Psychiatric:        Speech: Speech normal.        Behavior: Behavior is cooperative.      UC Treatments / Results  Labs (all labs ordered are listed, but only abnormal results are displayed) Labs Reviewed  COMPREHENSIVE METABOLIC PANEL WITH GFR  CBC WITH DIFFERENTIAL/PLATELET  TSH  LIPID PANEL    EKG   Radiology No results found.  Procedures Procedures (including critical care time)  Medications Ordered in UC Medications  cloNIDine  (CATAPRES ) tablet 0.2 mg (0.2 mg Oral Given 04/24/24 1616)  cloNIDine  (CATAPRES ) tablet 0.1 mg (0.1 mg Oral Given 04/24/24 1702)    Initial Impression / Assessment and Plan / UC Course  I have reviewed the triage vital signs and the nursing notes.  Pertinent labs & imaging results that were available during my care of the patient were reviewed by me and considered in my medical decision making (see chart for details).     Patient presents with severely elevated blood pressure noted at a dental visit and confirmed in clinic  with initial readings of 235/116 and 224/145. She remains asymptomatic and denies headache, chest pain, neurologic symptoms, or shortness of breath. Although there is no current evidence of end-organ damage, the persistently elevated pressures meet criteria for hypertensive urgency. She has a history of poorly controlled hypertension without current treatment and lacks a primary care  provider. Clonidine  0.3 mg was administered in clinic with significant improvement; blood pressure at discharge was 163/90. Amlodipine -valsartan  was prescribed for ongoing management along with low-dose aspirin . Baseline labs including CBC, CMP, TSH, and lipid panel were ordered to evaluate for underlying contributors and guide long-term treatment. Patient was counseled in detail on the risks of uncontrolled hypertension, instructed on proper home BP monitoring technique, advised to avoid tobacco, and referred to Dr. Catheryn Slocumb or another provider at her clinic for continued care. She was instructed to schedule a follow-up appointment first thing in the morning as only a 30-day supply of medication could be provided today. Return to urgent care or present to the emergency department for any new symptoms such as chest pain, vision changes, shortness of breath, severe headache, or neurological deficits.   Today's evaluation has revealed no signs of a dangerous process. Discussed diagnosis with patient and/or guardian. Patient and/or guardian aware of their diagnosis, possible red flag symptoms to watch out for and need for close follow up. Patient and/or guardian understands verbal and written discharge instructions. Patient and/or guardian comfortable with plan and disposition.  Patient and/or guardian has a clear mental status at this time, good insight into illness (after discussion and teaching) and has clear judgment to make decisions regarding their care  Documentation was completed with the aid of voice recognition  software. Transcription may contain typographical errors. Final Clinical Impressions(s) / UC Diagnoses   Final diagnoses:  Asymptomatic hypertensive urgency  Severe essential hypertension     Discharge Instructions      You were seen today for severely elevated blood pressure. Although you did not have any symptoms such as headache, chest pain, or vision changes, your readings were high enough to put you at risk for serious complications if left untreated. You were given a blood pressure medication in the clinic, and your blood pressure improved before discharge. You have been prescribed a daily medication to help manage your blood pressure, along with low-dose aspirin . It is important to take your medications exactly as directed and to avoid missing doses.  You should begin checking your blood pressure at home every day, preferably at the same time each day, and keep a written log of your readings to bring to your first follow-up visit. Use a validated home monitor and sit quietly for a few minutes before taking your reading. Avoid smoking, reduce salt intake, stay well hydrated, and aim for regular physical activity as tolerated.  You were referred to Dr. Catheryn Slocumb or another provider in her clinic and should call as soon as possible to schedule your first appointment. Only a 30-day supply of medication has been provided today, so timely follow-up is essential to continue care.  Go to the emergency department right away if you develop symptoms such as chest pain, shortness of breath, confusion, trouble speaking, weakness on one side, vision changes, or a severe headache that does not go away. Return to urgent care if your blood pressure remains consistently above 180/110 despite medication, or if you have questions or concerns before your scheduled appointment.      ED Prescriptions     Medication Sig Dispense Auth. Provider   amLODipine -valsartan  (EXFORGE ) 5-160 MG tablet Take 1  tablet by mouth daily. 30 tablet Iola Lukes, FNP   aspirin  EC 81 MG tablet Take 1 tablet (81 mg total) by mouth daily. Swallow whole. 30 tablet Iola Lukes, FNP      PDMP not reviewed this encounter.   Waleska Buttery,  Lucie, FNP 04/24/24 1749

## 2024-04-24 NOTE — ED Triage Notes (Signed)
 Provider LIZ informed of PT BP level

## 2024-04-24 NOTE — ED Triage Notes (Signed)
 PT sent from Dental office because of high BP while in office. BP 235/116 in triage.

## 2024-04-24 NOTE — ED Notes (Signed)
 PT sent from Dentist off because of HTN. PT BP in triage 235/116. Pt does not take BP meds . Pt denies any pain.

## 2024-04-25 ENCOUNTER — Ambulatory Visit (HOSPITAL_COMMUNITY): Payer: Self-pay
